# Patient Record
Sex: Female | Born: 1965 | Race: White | Hispanic: No | Marital: Single
Health system: Southern US, Community
[De-identification: ages and names within clinical notes are randomized; demographics above are authoritative.]

## PROBLEM LIST (undated history)

## (undated) DIAGNOSIS — E119 Type 2 diabetes mellitus without complications: Secondary | ICD-10-CM

---

## 2012-03-26 ENCOUNTER — Emergency Department: Payer: Self-pay | Admitting: Emergency Medicine

## 2012-03-26 LAB — COMPREHENSIVE METABOLIC PANEL
Albumin: 3.1 g/dL — ABNORMAL LOW (ref 3.4–5.0)
Alkaline Phosphatase: 140 U/L — ABNORMAL HIGH (ref 50–136)
Anion Gap: 8 (ref 7–16)
BUN: 9 mg/dL (ref 7–18)
Calcium, Total: 8.4 mg/dL — ABNORMAL LOW (ref 8.5–10.1)
Co2: 26 mmol/L (ref 21–32)
Creatinine: 0.6 mg/dL (ref 0.60–1.30)
EGFR (Non-African Amer.): >60
Glucose: 364 mg/dL — ABNORMAL HIGH (ref 65–99)
Osmolality: 293 (ref 275–301)
Potassium: 4 mmol/L (ref 3.5–5.1)
SGOT(AST): 51 U/L — ABNORMAL HIGH (ref 15–37)
SGPT (ALT): 54 U/L (ref 12–78)
Total Protein: 7.3 g/dL (ref 6.4–8.2)

## 2012-03-26 LAB — CBC
MCH: 35.8 pg — ABNORMAL HIGH (ref 26.0–34.0)
MCHC: 34 g/dL (ref 32.0–36.0)
Platelet: 112 10*3/uL — ABNORMAL LOW (ref 150–440)
RBC: 3.8 10*6/uL (ref 3.80–5.20)
RDW: 14.1 % (ref 11.5–14.5)
WBC: 4.2 10*3/uL (ref 3.6–11.0)

## 2012-03-26 LAB — LIPASE, BLOOD: Lipase: 140 U/L (ref 73–393)

## 2012-03-26 LAB — URINALYSIS, COMPLETE
Blood: NEGATIVE
Leukocyte Esterase: NEGATIVE
Nitrite: NEGATIVE
Ph: 7 (ref 4.5–8.0)
RBC,UR: 1 /HPF (ref 0–5)
Specific Gravity: 1.035 (ref 1.003–1.030)
Squamous Epithelial: 1

## 2014-04-25 ENCOUNTER — Emergency Department: Payer: Self-pay | Admitting: Emergency Medicine

## 2014-04-25 LAB — COMPREHENSIVE METABOLIC PANEL
ALBUMIN: 2.8 g/dL — AB (ref 3.4–5.0)
ALK PHOS: 178 U/L — AB (ref 46–116)
Anion Gap: 4 — ABNORMAL LOW (ref 7–16)
BUN: 4 mg/dL — ABNORMAL LOW (ref 7–18)
Bilirubin,Total: 0.3 mg/dL (ref 0.2–1.0)
CALCIUM: 8.3 mg/dL — AB (ref 8.5–10.1)
Chloride: 106 mmol/L (ref 98–107)
Co2: 31 mmol/L (ref 21–32)
Creatinine: 0.67 mg/dL (ref 0.60–1.30)
EGFR (African American): >60
GLUCOSE: 362 mg/dL — AB (ref 65–99)
Osmolality: 293 (ref 275–301)
Potassium: 3.7 mmol/L (ref 3.5–5.1)
SGOT(AST): 61 U/L — ABNORMAL HIGH (ref 15–37)
SGPT (ALT): 59 U/L (ref 14–63)
Sodium: 141 mmol/L (ref 136–145)
Total Protein: 6.4 g/dL (ref 6.4–8.2)

## 2014-04-25 LAB — CBC
HCT: 37.7 % (ref 35.0–47.0)
HGB: 12.8 g/dL (ref 12.0–16.0)
MCH: 36.4 pg — ABNORMAL HIGH (ref 26.0–34.0)
MCHC: 33.9 g/dL (ref 32.0–36.0)
MCV: 107 fL — ABNORMAL HIGH (ref 80–100)
Platelet: 77 10*3/uL — ABNORMAL LOW (ref 150–440)
RBC: 3.52 10*6/uL — AB (ref 3.80–5.20)
RDW: 13.8 % (ref 11.5–14.5)
WBC: 3.3 10*3/uL — AB (ref 3.6–11.0)

## 2014-04-25 LAB — URINALYSIS, COMPLETE
Bilirubin,UR: NEGATIVE
Blood: NEGATIVE
Glucose,UR: >=500 mg/dL (ref 0–75)
KETONE: NEGATIVE
Leukocyte Esterase: NEGATIVE
Nitrite: NEGATIVE
PH: 6 (ref 4.5–8.0)
PROTEIN: NEGATIVE
RBC,UR: 2 /HPF (ref 0–5)
SPECIFIC GRAVITY: 1.033 (ref 1.003–1.030)
Squamous Epithelial: <1
WBC UR: <1 /HPF (ref 0–5)

## 2014-04-26 ENCOUNTER — Emergency Department: Payer: Self-pay | Admitting: Emergency Medicine

## 2014-04-26 LAB — COMPREHENSIVE METABOLIC PANEL
ALBUMIN: 2.5 g/dL — AB (ref 3.4–5.0)
ALK PHOS: 150 U/L — AB (ref 46–116)
ANION GAP: 7 (ref 7–16)
AST: 69 U/L — AB (ref 15–37)
BUN: 5 mg/dL — ABNORMAL LOW (ref 7–18)
Bilirubin,Total: 0.4 mg/dL (ref 0.2–1.0)
CALCIUM: 8 mg/dL — AB (ref 8.5–10.1)
CO2: 24 mmol/L (ref 21–32)
Chloride: 108 mmol/L — ABNORMAL HIGH (ref 98–107)
Creatinine: 0.54 mg/dL — ABNORMAL LOW (ref 0.60–1.30)
EGFR (African American): >60
GLUCOSE: 281 mg/dL — AB (ref 65–99)
OSMOLALITY: 285 (ref 275–301)
Potassium: 4.9 mmol/L (ref 3.5–5.1)
SGPT (ALT): 57 U/L (ref 14–63)
Sodium: 139 mmol/L (ref 136–145)
Total Protein: 6.3 g/dL — ABNORMAL LOW (ref 6.4–8.2)

## 2014-04-26 LAB — URINALYSIS, COMPLETE
Bilirubin,UR: NEGATIVE
Glucose,UR: >=500 mg/dL (ref 0–75)
Ketone: NEGATIVE
Leukocyte Esterase: NEGATIVE
NITRITE: NEGATIVE
PH: 5 (ref 4.5–8.0)
PROTEIN: NEGATIVE
Specific Gravity: 1.041 (ref 1.003–1.030)
Squamous Epithelial: 3
WBC UR: 14 /HPF (ref 0–5)

## 2014-04-26 LAB — CBC
HCT: 39.4 % (ref 35.0–47.0)
HGB: 13.4 g/dL (ref 12.0–16.0)
MCH: 36.5 pg — ABNORMAL HIGH (ref 26.0–34.0)
MCHC: 34 g/dL (ref 32.0–36.0)
MCV: 107 fL — AB (ref 80–100)
Platelet: 77 10*3/uL — ABNORMAL LOW (ref 150–440)
RBC: 3.67 10*6/uL — ABNORMAL LOW (ref 3.80–5.20)
RDW: 14.1 % (ref 11.5–14.5)
WBC: 3.8 10*3/uL (ref 3.6–11.0)

## 2015-06-13 ENCOUNTER — Encounter: Payer: Self-pay | Admitting: Emergency Medicine

## 2015-06-13 ENCOUNTER — Emergency Department: Payer: Self-pay

## 2015-06-13 ENCOUNTER — Emergency Department
Admission: EM | Admit: 2015-06-13 | Discharge: 2015-06-13 | Disposition: A | Discharge status: Home / Self Care | Payer: Self-pay | Attending: Emergency Medicine | Admitting: Emergency Medicine

## 2015-06-13 DIAGNOSIS — R110 Nausea: Secondary | ICD-10-CM | POA: Insufficient documentation

## 2015-06-13 DIAGNOSIS — R1031 Right lower quadrant pain: Secondary | ICD-10-CM | POA: Insufficient documentation

## 2015-06-13 DIAGNOSIS — R109 Unspecified abdominal pain: Secondary | ICD-10-CM

## 2015-06-13 DIAGNOSIS — E119 Type 2 diabetes mellitus without complications: Secondary | ICD-10-CM | POA: Insufficient documentation

## 2015-06-13 HISTORY — DX: Type 2 diabetes mellitus without complications: E11.9

## 2015-06-13 LAB — URINALYSIS COMPLETE WITH MICROSCOPIC (ARMC ONLY)
Bacteria, UA: NONE SEEN
Bilirubin Urine: NEGATIVE
Glucose, UA: NEGATIVE mg/dL
HGB URINE DIPSTICK: NEGATIVE
Ketones, ur: NEGATIVE mg/dL
Leukocytes, UA: NEGATIVE
Nitrite: NEGATIVE
PH: 5 (ref 5.0–8.0)
PROTEIN: NEGATIVE mg/dL
SPECIFIC GRAVITY, URINE: 1.027 (ref 1.005–1.030)

## 2015-06-13 LAB — CBC WITH DIFFERENTIAL/PLATELET
Basophils Absolute: 0 10*3/uL (ref 0–0.1)
Basophils Relative: 1 %
EOS PCT: 2 %
Eosinophils Absolute: 0.1 10*3/uL (ref 0–0.7)
HCT: 48 % — ABNORMAL HIGH (ref 35.0–47.0)
HEMOGLOBIN: 16.3 g/dL — AB (ref 12.0–16.0)
LYMPHS ABS: 1.2 10*3/uL (ref 1.0–3.6)
LYMPHS PCT: 33 %
MCH: 35.8 pg — AB (ref 26.0–34.0)
MCHC: 34 g/dL (ref 32.0–36.0)
MCV: 105.3 fL — AB (ref 80.0–100.0)
Monocytes Absolute: 0.3 10*3/uL (ref 0.2–0.9)
Monocytes Relative: 10 %
Neutro Abs: 1.9 10*3/uL (ref 1.4–6.5)
Neutrophils Relative %: 54 %
PLATELETS: 105 10*3/uL — AB (ref 150–440)
RBC: 4.56 MIL/uL (ref 3.80–5.20)
RDW: 13.5 % (ref 11.5–14.5)
WBC: 3.5 10*3/uL — AB (ref 3.6–11.0)

## 2015-06-13 LAB — BASIC METABOLIC PANEL
Anion gap: 10 (ref 5–15)
BUN: 13 mg/dL (ref 6–20)
CHLORIDE: 104 mmol/L (ref 101–111)
CO2: 25 mmol/L (ref 22–32)
Calcium: 9.4 mg/dL (ref 8.9–10.3)
Creatinine, Ser: 0.79 mg/dL (ref 0.44–1.00)
GFR calc Af Amer: >60 mL/min (ref >60)
GFR calc non Af Amer: >60 mL/min (ref >60)
GLUCOSE: 183 mg/dL — AB (ref 65–99)
POTASSIUM: 3.6 mmol/L (ref 3.5–5.1)
SODIUM: 139 mmol/L (ref 135–145)

## 2015-06-13 MED ORDER — ONDANSETRON HCL 4 MG/2ML IJ SOLN
4.0000 mg | Freq: Once | INTRAMUSCULAR | Status: AC
  Administered 2015-06-13: 4 mg via INTRAVENOUS
  Filled 2015-06-13: qty 2

## 2015-06-13 MED ORDER — HYDROCODONE-ACETAMINOPHEN 5-325 MG PO TABS: 1.0000 | ORAL_TABLET | ORAL | Status: AC | PRN

## 2015-06-13 MED ORDER — PANTOPRAZOLE SODIUM 20 MG PO TBEC: 20.0000 mg | DELAYED_RELEASE_TABLET | Freq: Every day | ORAL | Status: AC

## 2015-06-13 MED ORDER — HYDROCODONE-ACETAMINOPHEN 5-325 MG PO TABS
1.0000 | ORAL_TABLET | Freq: Once | ORAL | Status: AC
  Administered 2015-06-13: 1 via ORAL
  Filled 2015-06-13: qty 1

## 2015-06-13 MED ORDER — KETOROLAC TROMETHAMINE 30 MG/ML IJ SOLN
30.0000 mg | Freq: Once | INTRAMUSCULAR | Status: AC
  Administered 2015-06-13: 30 mg via INTRAVENOUS
  Filled 2015-06-13: qty 1

## 2015-06-13 NOTE — ED Notes (Signed)
Pt here for right sided flank pain that has been ongoing for 2 days.  Reports nausea.

## 2015-06-13 NOTE — ED Provider Notes (Signed)
Time Seen: Approximately 1205  I have reviewed the triage notes  Chief Complaint: Flank Pain   History of Present Illness: Michelle Hickman is a 50 y.o. female who states a 2 day history of first intermittent and relatively constant right flank discomfort. She states she's had a history of renal colic and was concerned that she may have a CAT scan states her last kidney stone was approximately one year ago. She has had some nausea but no persistent vomiting. She does have a history of diabetes and hasn't noticed any increased thirst or urination or any painful urination. She denies any hematuria. She denies any left-sided abdominal or flank pain. She states the pain has become relatively constant and she has a hard time finding a comfortable position.   Past Medical History  Diagnosis Date  . Diabetes mellitus without complication (HCC)     There are no active problems to display for this patient.   No past surgical history on file.  No past surgical history on file.  Current Outpatient Rx  Name  Route  Sig  Dispense  Refill  . HYDROcodone-acetaminophen (NORCO) 5-325 MG tablet   Oral   Take 1 tablet by mouth every 4 (four) hours as needed for moderate pain.   12 tablet   0   . pantoprazole (PROTONIX) 20 MG tablet   Oral   Take 1 tablet (20 mg total) by mouth daily.   30 tablet   1     Allergies:  Tramadol  Family History: No family history on file.  Social History: Social History  Substance Use Topics  . Smoking status: Not on file  . Smokeless tobacco: Not on file  . Alcohol Use: Not on file     Review of Systems:   10 point review of systems was performed and was otherwise negative:  Constitutional: No fever Eyes: No visual disturbances ENT: No sore throat, ear pain Cardiac: No chest pain Respiratory: No shortness of breath, wheezing, or stridor Abdomen: No abdominal pain, no vomiting, No diarrhea Endocrine: No weight loss, No night  sweats Extremities: No peripheral edema, cyanosis Skin: No rashes, easy bruising Neurologic: No focal weakness, trouble with speech or swollowing Urologic: No dysuria, Hematuria, or urinary frequency   Physical Exam:  ED Triage Vitals  Enc Vitals Group     BP 06/13/15 1117 115/56 mmHg     Pulse Rate 06/13/15 1117 61     Resp 06/13/15 1117 18     Temp 06/13/15 1117 98.1 F (36.7 C)     Temp Source 06/13/15 1117 Oral     SpO2 06/13/15 1117 100 %     Weight 06/13/15 1117 241 lb (109.317 kg)     Height 06/13/15 1117  (1.676 m)     Head Cir --      Peak Flow --      Pain Score 06/13/15 1116 8     Pain Loc --      Pain Edu? --      Excl. in GC? --     General: Awake , Alert , and Oriented times 3; GCS 15 patient does not appear to be in any distress Head: Normal cephalic , atraumatic Eyes: Pupils equal , round, reactive to light Nose/Throat: No nasal drainage, patent upper airway without erythema or exudate.  Neck: Supple, Full range of motion, No anterior adenopathy or palpable thyroid masses Lungs: Clear to ascultation without wheezes , rhonchi, or rales Heart: Regular rate, regular  rhythm without murmurs , gallops , or rubs Abdomen: Soft, non tender without rebound, guarding , or rigidity; bowel sounds positive and symmetric in all 4 quadrants. No organomegaly .        Extremities: 2 plus symmetric pulses. No edema, clubbing or cyanosis Neurologic: normal ambulation, Motor symmetric without deficits, sensory intact Skin: warm, dry, no rashes   Labs:   All laboratory work was reviewed including any pertinent negatives or positives listed below:  Labs Reviewed  BASIC METABOLIC PANEL - Abnormal; Notable for the following:    Glucose, Bld 183 (*)    All other components within normal limits  CBC WITH DIFFERENTIAL/PLATELET - Abnormal; Notable for the following:    WBC 3.5 (*)    Hemoglobin 16.3 (*)    HCT 48.0 (*)    MCV 105.3 (*)    MCH 35.8 (*)    Platelets 105  (*)    All other components within normal limits  URINALYSIS COMPLETEWITH MICROSCOPIC ([facility] ONLY) - Abnormal; Notable for the following:    Color, Urine AMBER (*)    APPearance HAZY (*)    Squamous Epithelial / LPF 0-5 (*)    All other components within normal limits  Reviewed the patient's laboratory work shows no significant abnormalities  Radiology:   EXAM: CT ABDOMEN AND PELVIS WITHOUT CONTRAST  TECHNIQUE: Multidetector CT imaging of the abdomen and pelvis was performed following the standard protocol without IV contrast.  COMPARISON: 04/26/2014  FINDINGS: Lower chest: Unremarkable.  Hepatobiliary: Nodular liver contour compatible with cirrhosis. Gallbladder surgically absent. No intrahepatic or extrahepatic biliary dilation.  Pancreas: No focal mass lesion. No dilatation of the main duct. No intraparenchymal cyst. No peripancreatic edema.  Spleen: No splenomegaly. No focal mass lesion.  Adrenals/Urinary Tract: No adrenal nodule or mass. Kidneys are unremarkable. No evidence for hydroureter. The urinary bladder appears normal for the degree of distention.  Stomach/Bowel: Stomach is nondistended. No gastric wall thickening. No evidence of outlet obstruction. Duodenum is normally positioned as is the ligament of Treitz. No small bowel wall thickening. No small bowel dilatation. The terminal ileum is normal. The appendix is normal. No gross colonic mass. No colonic wall thickening. No substantial diverticular change.  Vascular/Lymphatic: There is abdominal aortic atherosclerosis without aneurysm. There is no gastrohepatic or hepatoduodenal ligament lymphadenopathy. No intraperitoneal or retroperitoneal lymphadenopathy. No pelvic sidewall lymphadenopathy.  Reproductive: Uterus is surgically absent. There is no adnexal mass.  Other: Trace intraperitoneal free fluid is evident.  Musculoskeletal: Bone windows reveal no worrisome lytic or sclerotic osseous  lesions.  IMPRESSION: 1. No acute findings in the abdomen or pelvis. Specifically, no evidence to explain the history of right flank pain. 2. Nodular hepatic contour compatible with cirrhosis. 3. Trace intraperitoneal free fluid, nonspecific in a premenopausal female.    I personally reviewed the radiologic studies     ED Course: The patient's stay here was uneventful and her objective findings really showed no significant results. She has some small amount of calcium oxalate crystals in her urine which may indicate recently passed kidney stone but certainly there is no signs of an active urinary outlet obstruction and also does not have any other surgical findings such as bowel obstruction, acute appendicitis, etc. Patient denies any shortness of breath productive cough or any symptoms consistent with an intrathoracic cause   Assessment:  Acute onset of right-sided flank pain unspecified     Plan: Outpatient management Patient was advised to return immediately if condition worsens. Patient was advised to follow up  with their primary care physician or other specialized physicians involved in their outpatient care. The patient and/or family member/power of attorney had laboratory results reviewed at the bedside. All questions and concerns were addressed and appropriate discharge instructions were distributed by the nursing staff.             Jennye MoccasinBrian S Simrit Gohlke, MD 06/13/15 [phone]

## 2015-06-13 NOTE — Discharge Instructions (Signed)
Abdominal Pain, Adult °Many things can cause belly (abdominal) pain. Most times, the belly pain is not dangerous. Many cases of belly pain can be watched and treated at home. °HOME CARE  °· Do not take medicines that help you go poop (laxatives) unless told to by your doctor. °· Only take medicine as told by your doctor. °· Eat or drink as told by your doctor. Your doctor will tell you if you should be on a special diet. °GET HELP IF: °· You do not know what is causing your belly pain. °· You have belly pain while you are sick to your stomach (nauseous) or have runny poop (diarrhea). °· You have pain while you pee or poop. °· Your belly pain wakes you up at night. °· You have belly pain that gets worse or better when you eat. °· You have belly pain that gets worse when you eat fatty foods. °· You have a fever. °GET HELP RIGHT AWAY IF:  °· The pain does not go away within 2 hours. °· You keep throwing up (vomiting). °· The pain changes and is only in the right or left part of the belly. °· You have bloody or tarry looking poop. °MAKE SURE YOU:  °· Understand these instructions. °· Will watch your condition. °· Will get help right away if you are not doing well or get worse. °  °This information is not intended to replace advice given to you by your health care provider. Make sure you discuss any questions you have with your health care provider. °  °Document Released: 09/03/2007 Document Revised: 04/07/2014 Document Reviewed: 11/24/2012 °Elsevier Interactive Patient Education ©2016 [employer] ° °Please return immediately if condition worsens. Please contact her primary physician or the physician you were given for referral. If you have any specialist physicians involved in her treatment and plan please also contact them. Thank you for using [facility]. ° °

## 2017-07-29 IMAGING — CT CT RENAL STONE PROTOCOL
1 of 2 series · 15 of 32 positions shown, 19 images · non-contrast
Comparison: 04/26/2014

CLINICAL DATA: Right flank pain for 2 days with nausea vomiting.

EXAM:
CT ABDOMEN AND PELVIS WITHOUT CONTRAST
TECHNIQUE: Multidetector CT imaging of the abdomen and pelvis was performed
following the standard protocol without IV contrast.

[Series 2: stone standard full · axial · 0.78mm/px · z∈[-1162,-702]mm · 15 of 102 slices shown, 19 images]
[im 5/102  soft-tissue]
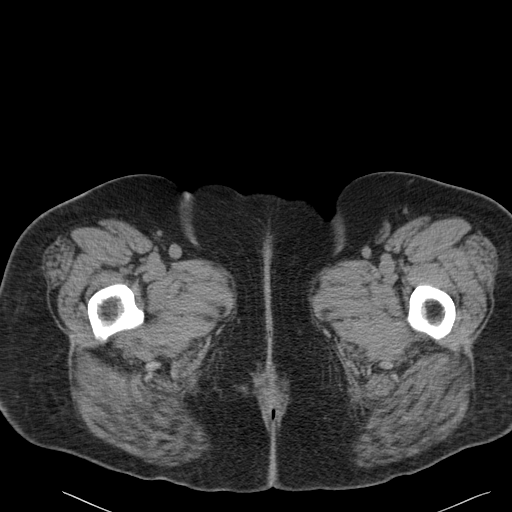
[im 5/102  bone]
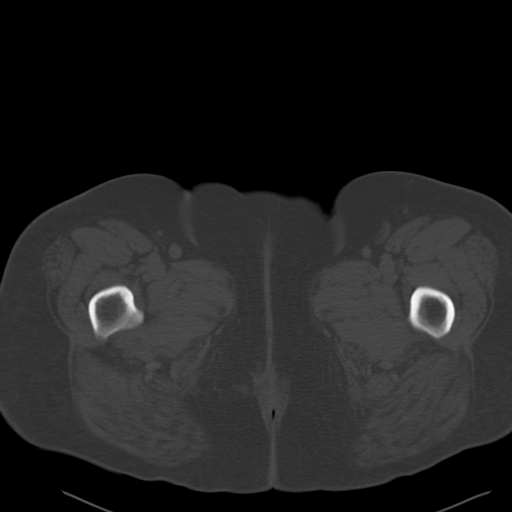
[im 13/102  soft-tissue]
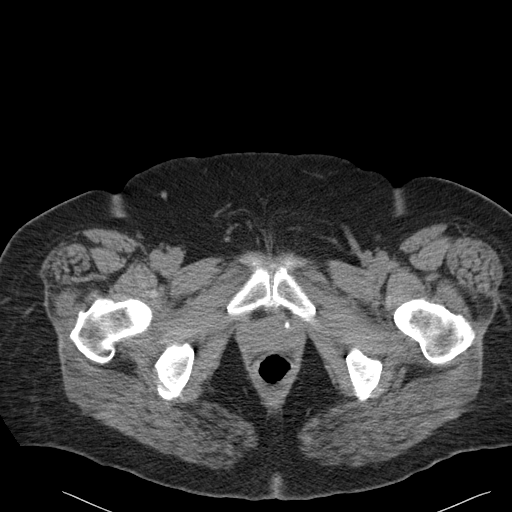
[im 21/102  soft-tissue]
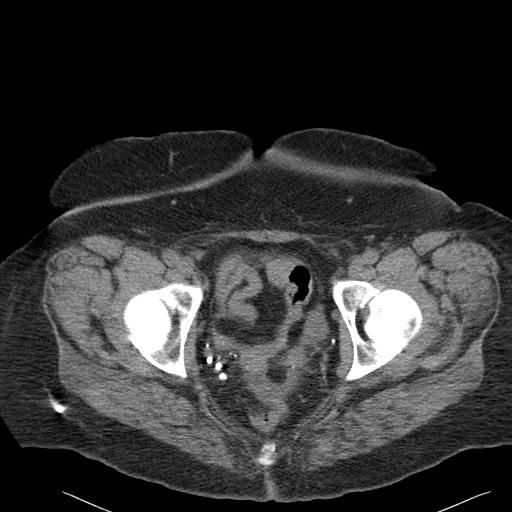
[im 29/102  soft-tissue]
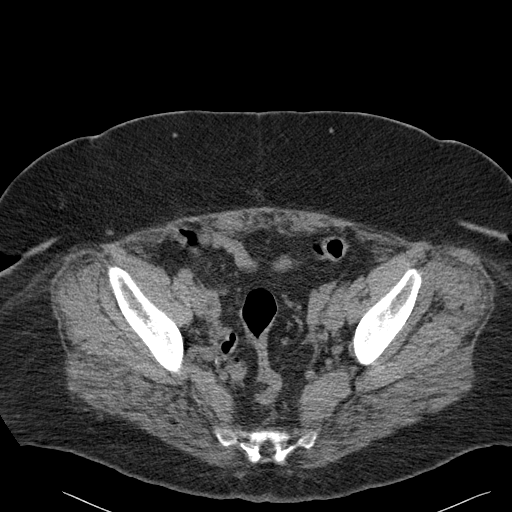
[im 37/102  soft-tissue]
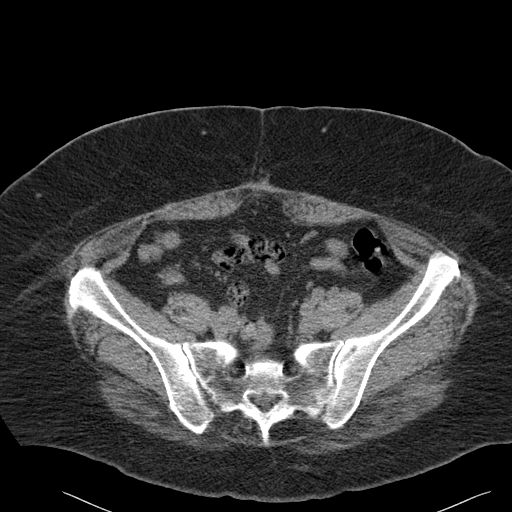
[im 45/102  soft-tissue]
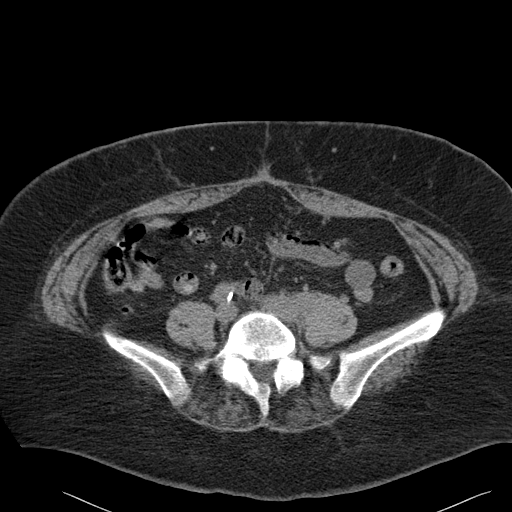
[im 53/102  soft-tissue]
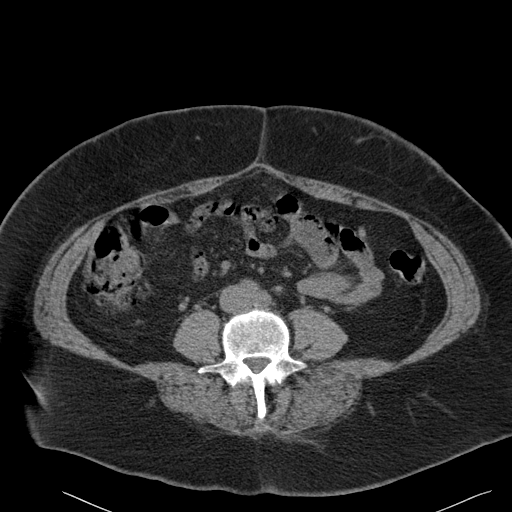
[im 57/102  soft-tissue]
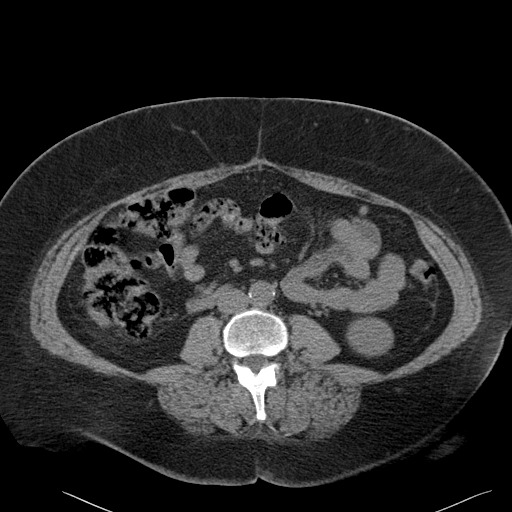
[im 65/102  soft-tissue]
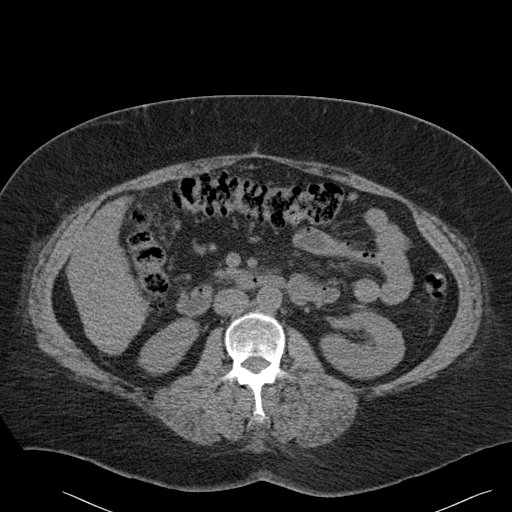
[im 65/102  bone]
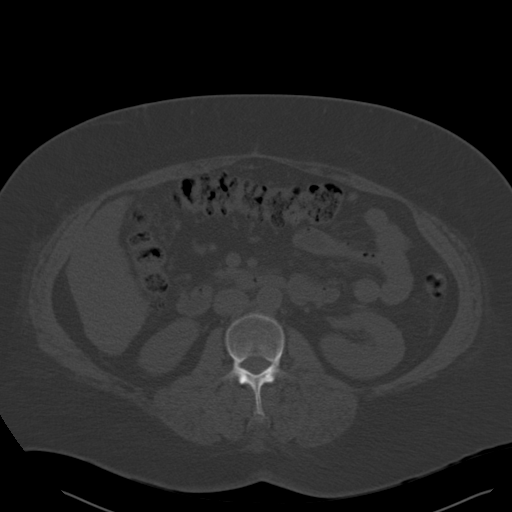
[im 73/102  soft-tissue]
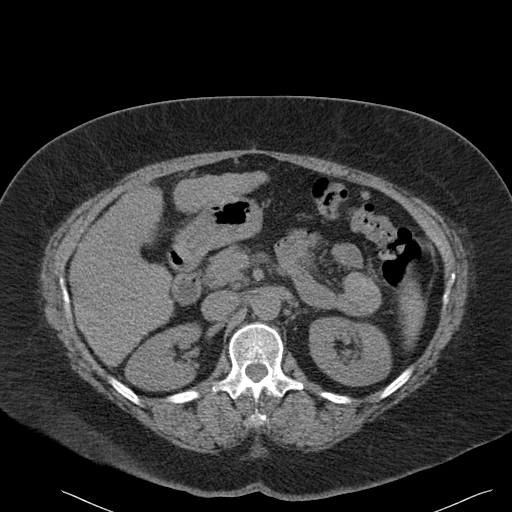
[im 81/102  soft-tissue]
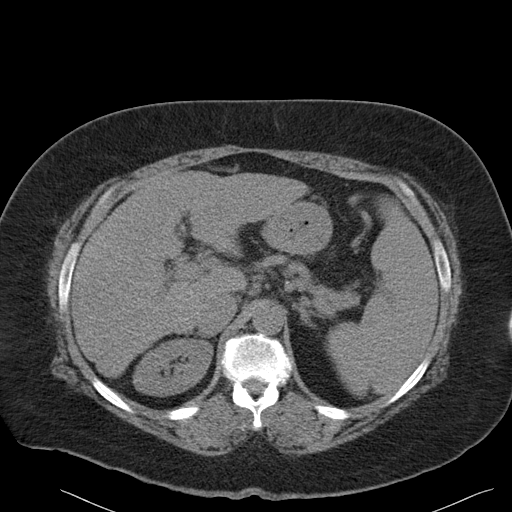
[im 85/102  lung]
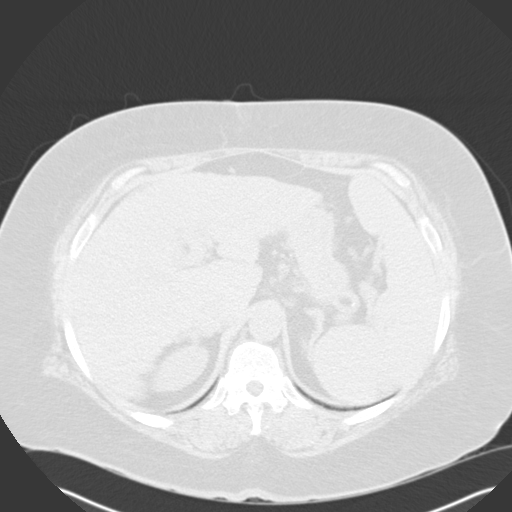
[im 89/102  soft-tissue]
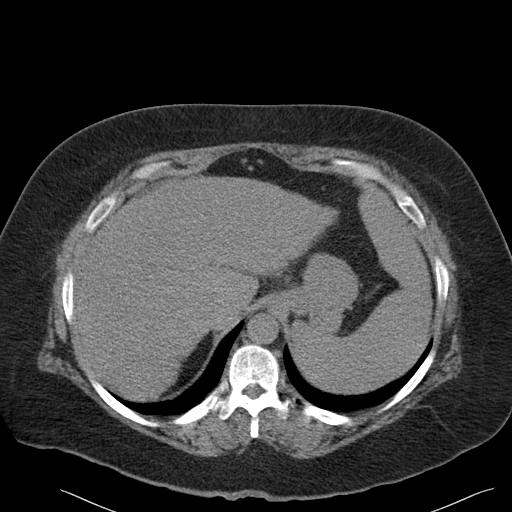
[im 89/102  lung]
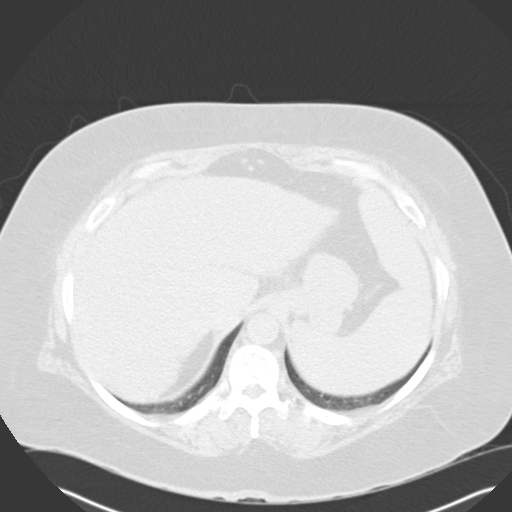
[im 93/102  lung]
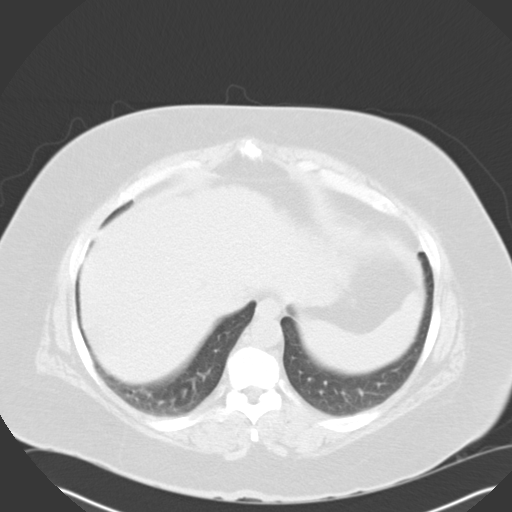
[im 97/102  soft-tissue]
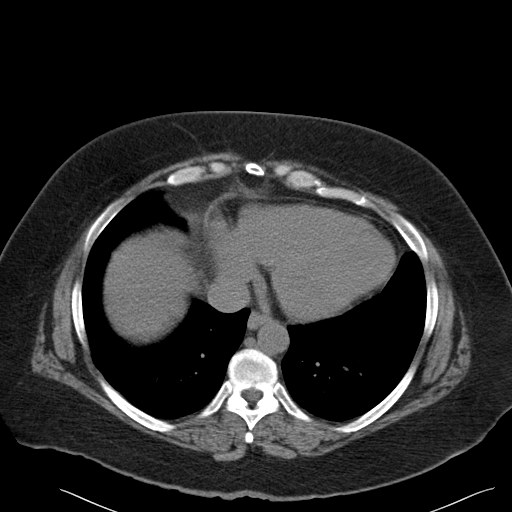
[im 97/102  lung]
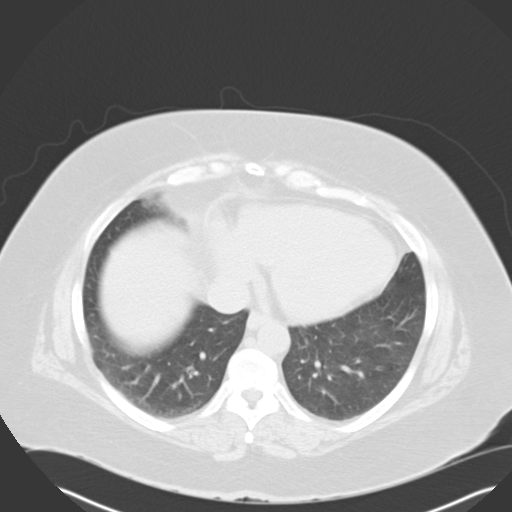

[15 of 32 positions shown; findings below may reference images not displayed]

FINDINGS: Lower chest:  Unremarkable.

Hepatobiliary: Nodular liver contour compatible with cirrhosis.
Gallbladder surgically absent. No intrahepatic or extrahepatic
biliary dilation.

Pancreas: No focal mass lesion. No dilatation of the main duct. No
intraparenchymal cyst. No peripancreatic edema.

Spleen: No splenomegaly. No focal mass lesion.

Adrenals/Urinary Tract: No adrenal nodule or mass. Kidneys are
unremarkable. No evidence for hydroureter. The urinary bladder
appears normal for the degree of distention.

Stomach/Bowel: Stomach is nondistended. No gastric wall thickening.
No evidence of outlet obstruction. Duodenum is normally positioned
as is the ligament of Treitz. No small bowel wall thickening. No
small bowel dilatation. The terminal ileum is normal. The appendix
is normal. No gross colonic mass. No colonic wall thickening. No
substantial diverticular change.

Vascular/Lymphatic: There is abdominal aortic atherosclerosis
without aneurysm. There is no gastrohepatic or hepatoduodenal
ligament lymphadenopathy. No intraperitoneal or retroperitoneal
lymphadenopathy. No pelvic sidewall lymphadenopathy.

Reproductive: Uterus is surgically absent. There is no adnexal mass.

Other: Trace intraperitoneal free fluid is evident.

Musculoskeletal: Bone windows reveal no worrisome lytic or sclerotic
osseous lesions.
IMPRESSION: 1. No acute findings in the abdomen or pelvis. Specifically, no
evidence to explain the history of right flank pain.
2. Nodular hepatic contour compatible with cirrhosis.
3. Trace intraperitoneal free fluid, nonspecific in a premenopausal
female.

## 2021-11-29 DEATH — deceased
